# Patient Record
Sex: Male | Born: 1995 | Race: White | Hispanic: No | Marital: Single | Smoking: Never smoker
Health system: Southern US, Community
[De-identification: ages and names within clinical notes are randomized; demographics above are authoritative.]

## PROBLEM LIST (undated history)

## (undated) DIAGNOSIS — M869 Osteomyelitis, unspecified: Secondary | ICD-10-CM

## (undated) HISTORY — PX: SKIN BIOPSY: SHX1

---
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
## 2014-06-13 ENCOUNTER — Emergency Department (HOSPITAL_COMMUNITY)
Admission: EM | Admit: 2014-06-13 | Discharge: 2014-06-13 | Disposition: A | Discharge status: Home / Self Care | Payer: Medicaid Other | Attending: Emergency Medicine | Admitting: Emergency Medicine

## 2014-06-13 ENCOUNTER — Encounter (HOSPITAL_COMMUNITY): Payer: Self-pay | Admitting: Emergency Medicine

## 2014-06-13 DIAGNOSIS — R110 Nausea: Secondary | ICD-10-CM

## 2014-06-13 DIAGNOSIS — Z79899 Other long term (current) drug therapy: Secondary | ICD-10-CM | POA: Insufficient documentation

## 2014-06-13 DIAGNOSIS — K529 Noninfective gastroenteritis and colitis, unspecified: Secondary | ICD-10-CM

## 2014-06-13 DIAGNOSIS — M549 Dorsalgia, unspecified: Secondary | ICD-10-CM | POA: Insufficient documentation

## 2014-06-13 DIAGNOSIS — Z880 Allergy status to penicillin: Secondary | ICD-10-CM | POA: Insufficient documentation

## 2014-06-13 DIAGNOSIS — R1033 Periumbilical pain: Secondary | ICD-10-CM | POA: Insufficient documentation

## 2014-06-13 DIAGNOSIS — R1030 Lower abdominal pain, unspecified: Secondary | ICD-10-CM

## 2014-06-13 LAB — COMPREHENSIVE METABOLIC PANEL
ALBUMIN: 5 g/dL (ref 3.5–5.2)
ALK PHOS: 60 U/L (ref 39–117)
ALT: 22 U/L (ref 0–53)
ANION GAP: 6 (ref 5–15)
AST: 23 U/L (ref 0–37)
BUN: 9 mg/dL (ref 6–23)
CO2: 28 mmol/L (ref 19–32)
CREATININE: 0.82 mg/dL (ref 0.50–1.35)
Calcium: 8.9 mg/dL (ref 8.4–10.5)
Chloride: 106 mmol/L (ref 96–112)
GFR calc non Af Amer: >90 mL/min (ref >90)
GLUCOSE: 107 mg/dL — AB (ref 70–99)
Potassium: 3.8 mmol/L (ref 3.5–5.1)
Sodium: 140 mmol/L (ref 135–145)
TOTAL PROTEIN: 7.7 g/dL (ref 6.0–8.3)
Total Bilirubin: 1.2 mg/dL (ref 0.3–1.2)

## 2014-06-13 LAB — CBC WITH DIFFERENTIAL/PLATELET
BASOS ABS: 0 10*3/uL (ref 0.0–0.1)
Basophils Relative: 0 % (ref 0–1)
EOS PCT: 3 % (ref 0–5)
Eosinophils Absolute: 0.2 10*3/uL (ref 0.0–0.7)
HEMATOCRIT: 44.7 % (ref 39.0–52.0)
Hemoglobin: 15.6 g/dL (ref 13.0–17.0)
LYMPHS ABS: 1.6 10*3/uL (ref 0.7–4.0)
LYMPHS PCT: 20 % (ref 12–46)
MCH: 29.3 pg (ref 26.0–34.0)
MCHC: 34.9 g/dL (ref 30.0–36.0)
MCV: 83.9 fL (ref 78.0–100.0)
MONO ABS: 0.4 10*3/uL (ref 0.1–1.0)
Monocytes Relative: 5 % (ref 3–12)
Neutro Abs: 5.9 10*3/uL (ref 1.7–7.7)
Neutrophils Relative %: 72 % (ref 43–77)
Platelets: 223 10*3/uL (ref 150–400)
RBC: 5.33 MIL/uL (ref 4.22–5.81)
RDW: 12.8 % (ref 11.5–15.5)
WBC: 8.1 10*3/uL (ref 4.0–10.5)

## 2014-06-13 LAB — LIPASE, BLOOD: Lipase: 27 U/L (ref 11–59)

## 2014-06-13 MED ORDER — ONDANSETRON 8 MG PO TBDP
8.0000 mg | ORAL_TABLET | Freq: Once | ORAL | Status: AC
  Administered 2014-06-13: 8 mg via ORAL
  Filled 2014-06-13: qty 1

## 2014-06-13 MED ORDER — HYDROCODONE-ACETAMINOPHEN 5-325 MG PO TABS: 1.0000 | ORAL_TABLET | Freq: Four times a day (QID) | ORAL | Status: AC | PRN

## 2014-06-13 MED ORDER — NAPROXEN 500 MG PO TABS: 500.0000 mg | ORAL_TABLET | Freq: Two times a day (BID) | ORAL | Status: AC | PRN

## 2014-06-13 MED ORDER — ONDANSETRON 4 MG PO TBDP: 4.0000 mg | ORAL_TABLET | Freq: Three times a day (TID) | ORAL | Status: DC | PRN

## 2014-06-13 MED ORDER — HYDROCODONE-ACETAMINOPHEN 5-325 MG PO TABS
1.0000 | ORAL_TABLET | Freq: Once | ORAL | Status: AC
  Administered 2014-06-13: 1 via ORAL
  Filled 2014-06-13: qty 1

## 2014-06-13 NOTE — Progress Notes (Signed)
EDCM spoke to patient at bedside.  Patient listed as having Medicaid insurance however, patient reports "It expired."  [facility] which had [facility] listed as his pcp.  [address] [place] [address]  Patient reports this ios still his pcp but wants to change it.  [facility] with phone number and address to DSS.  EDCM instructed patient to contact DSS to check status of Medicaid and to change pcp to receive new Medicaid card.  [facility] with list of [place].  Patient thankful for resources.  No further EDCM needs at this time.

## 2014-06-13 NOTE — ED Provider Notes (Signed)
CSN: [id number]     Arrival date & time 06/13/14  1540 History   First MD Initiated Contact with Patient 06/13/14 1630     Chief Complaint  Patient presents with  . Abdominal Pain     (Consider location/radiation/quality/duration/timing/severity/associated sxs/prior Treatment) HPI Comments: Christopher Horton is a 19 y.o. male with a PMHx of remote osteomyelitis but otherwise healthy, who presents to the ED with complaints of lower abdominal/periumbilical pain that began gradually last night after he ate a lobster-claw at his girlfriend's house, and has gradually worsened since then. He states the pain is 6/10 tightness dull burning, radiating to his back, constant, waxing and waning, with no known aggravating factors, and unrelieved Pepto-Bismol. He states he ate the lobster-claw which she has eaten in the past, but since the pain started after eating this he is unsure if this was the source of the problem, but that no other family members have become ill. He denies any fevers, chills, chest pain, shortness of breath, vomiting, diarrhea, constipation, melena, hematochezia, obstipation, rectal pain, flank pain, dysuria, hematuria, testicular pain or swelling, penile discharge, numbness, tingling, weakness, sick contacts, alcohol use, NSAID use, recent travel, or antibiotic use. His last bowel movement was yesterday around midday, and he reports that it was "normal".  Patient is a 19 y.o. male presenting with abdominal pain. The history is provided by the patient. No language interpreter was used.  Abdominal Pain Pain location:  Periumbilical Pain quality: burning and dull   Pain quality comment:  Tightness/dull/burning Pain radiates to:  Back Pain severity:  Moderate Onset quality:  Gradual Duration:  1 day Timing:  Constant Progression:  Worsening Chronicity:  New Context: suspicious food intake   Context: not alcohol use, not recent illness, not recent travel and not sick contacts     Relieved by:  Nothing Worsened by:  Nothing tried Ineffective treatments:  Antacids (pepto bismol) Associated symptoms: nausea   Associated symptoms: no chest pain, no chills, no constipation, no diarrhea, no dysuria, no fever, no flatus, no hematemesis, no hematochezia, no hematuria, no melena, no shortness of breath and no vomiting   Risk factors: no alcohol abuse, has not had multiple surgeries, no NSAID use and not obese     Past Medical History  Diagnosis Date  . Osteomyelitis    Past Surgical History  Procedure Laterality Date  . Skin biopsy     No family history on file. History  Substance Use Topics  . Smoking status: Never Smoker   . Smokeless tobacco: Not on file  . Alcohol Use: No    Review of Systems  Constitutional: Negative for fever and chills.  Respiratory: Negative for shortness of breath.   Cardiovascular: Negative for chest pain.  Gastrointestinal: Positive for nausea and abdominal pain. Negative for vomiting, diarrhea, constipation, blood in stool, melena, hematochezia, rectal pain, flatus and hematemesis.  Genitourinary: Negative for dysuria, hematuria, flank pain, discharge, scrotal swelling and testicular pain.  Musculoskeletal: Positive for back pain (radiating from abdomen). Negative for myalgias and arthralgias.  Skin: Negative for rash.  Allergic/Immunologic: Negative for immunocompromised state.  Neurological: Negative for weakness and numbness.  Psychiatric/Behavioral: Negative for confusion.   10 Systems reviewed and are negative for acute change except as noted in the HPI.    Allergies  Peanut-containing drug products; Dust mite extract; and Amoxicillin  Home Medications   Prior to Admission medications   Medication Sig Start Date End Date Taking? Authorizing Provider  methocarbamol (ROBAXIN) 500 MG tablet Take 1  tablet (500 mg total) by mouth 2 (two) times daily as needed for muscle spasms. Patient not taking: Reported on 06/13/2014  02/09/13   Emilia Beck, PA-C  naproxen (NAPROSYN) 500 MG tablet Take 1 tablet (500 mg total) by mouth 2 (two) times daily with a meal. Patient not taking: Reported on 06/13/2014 02/09/13   Kaitlyn Szekalski, PA-C   BP 135/66 mmHg  Pulse 66  Temp(Src) 98.2 F (36.8 C) (Oral)  Resp 20  SpO2 100% Physical Exam  Constitutional: He is oriented to person, place, and time. Vital signs are normal. He appears well-developed and well-nourished.  Non-toxic appearance. No distress.  Afebrile, nontoxic, NAD  HENT:  Head: Normocephalic and atraumatic.  Mouth/Throat: Oropharynx is clear and moist and mucous membranes are normal.  Eyes: Conjunctivae and EOM are normal. Right eye exhibits no discharge. Left eye exhibits no discharge.  Neck: Normal range of motion. Neck supple.  Cardiovascular: Normal rate, regular rhythm, normal heart sounds and intact distal pulses.  Exam reveals no gallop and no friction rub.   No murmur heard. Pulmonary/Chest: Effort normal and breath sounds normal. No respiratory distress. He has no decreased breath sounds. He has no wheezes. He has no rhonchi. He has no rales.  Abdominal: Soft. Normal appearance and bowel sounds are normal. He exhibits no distension. There is tenderness in the suprapubic area. There is no rigidity, no rebound, no guarding, no CVA tenderness, no tenderness at McBurney's point and negative Murphy's sign.    Soft, nondistended, +BS throughout, with very mild suprapubic and R lateral abd TTP, no r/g/r, neg murphy's, neg mcburney's, no CVA TTP   Musculoskeletal: Normal range of motion.  Neurological: He is alert and oriented to person, place, and time. He has normal strength. No sensory deficit.  Skin: Skin is warm, dry and intact. No rash noted.  Psychiatric: He has a normal mood and affect.  Nursing note and vitals reviewed.   ED Course  Procedures (including critical care time) Labs Review Labs Reviewed  COMPREHENSIVE METABOLIC PANEL -  Abnormal; Notable for the following:    Glucose, Bld 107 (*)    All other components within normal limits  LIPASE, BLOOD  CBC WITH DIFFERENTIAL/PLATELET  URINALYSIS, ROUTINE W REFLEX MICROSCOPIC    Imaging Review No results found.   EKG Interpretation None      MDM   Final diagnoses:  Lower abdominal pain  Nausea  Gastroenteritis    19 y.o. male here with lower abd pain after eating lobster claw last night, associated with nausea. On exam, very slightly suprapubic tenderness and R lateral abd tenderness, non-peritoneal exam. VSS, overall well appearing. No rectal pain or urinary complaints. Will get basic labs but doubt need for imaging at this time unless labs show any acute abnormalities. Likely just mild gastroenteritis or colitis from foodbourne source, although no diarrhea today. Could be viral illness. Will await labs. Will give pain and nausea meds PO now since pt states his nausea has improved. Doubt need for IV at this time. Will recheck after labs.   6:46 PM CMP unremarkable. Lipase WNL. CBC and U/A WNL. Likely viral gastroenteritis. Tolerating PO well here, and feels improved with both nausea and pain. Will send home with naprosyn/norco/zofran. Will have him f/up with PCP in 1wk. I explained the diagnosis and have given explicit precautions to return to the ER including for any other new or worsening symptoms. The patient understands and accepts the medical plan as it's been dictated and I have answered their  questions. Discharge instructions concerning home care and prescriptions have been given. The patient is STABLE and is discharged to home in good condition.  BP 135/66 mmHg  Pulse 66  Temp(Src) 98.2 F (36.8 C) (Oral)  Resp 20  SpO2 100%  Meds ordered this encounter  Medications  . HYDROcodone-acetaminophen (NORCO/VICODIN) 5-325 MG per tablet 1 tablet    Sig:   . ondansetron (ZOFRAN-ODT) disintegrating tablet 8 mg    Sig:   . HYDROcodone-acetaminophen (NORCO)  5-325 MG per tablet    Sig: Take 1 tablet by mouth every 6 (six) hours as needed for severe pain.    Dispense:  6 tablet    Refill:  0    Order Specific Question:  Supervising Provider    Answer:  MILLER, BRIAN [[id number]]  . ondansetron (ZOFRAN ODT) 4 MG disintegrating tablet    Sig: Take 1 tablet (4 mg total) by mouth every 8 (eight) hours as needed for nausea or vomiting.    Dispense:  15 tablet    Refill:  0    Order Specific Question:  Supervising Provider    Answer:  MILLER, BRIAN [[id number]]  . naproxen (NAPROSYN) 500 MG tablet    Sig: Take 1 tablet (500 mg total) by mouth 2 (two) times daily as needed for mild pain, moderate pain or headache (TAKE WITH MEALS.).    Dispense:  20 tablet    Refill:  0    Order Specific Question:  Supervising Provider    Answer:  Eber Hong [[id number]]     Rasa Degrazia Camprubi-Soms, PA-C 06/13/14 1846  Lorre Nick, MD 06/14/14 1527

## 2014-06-13 NOTE — ED Notes (Signed)
Pt reports lower abdominal pain starting last night accompanied by nausea; denies vomiting or diarrhea. Pt reports last BM yesterday.

## 2014-06-13 NOTE — Discharge Instructions (Signed)
Your abdominal pain could be from the food you ate, or from a virus. Use zofran as needed for nausea, and naprosyn and norco as directed as needed for pain but don't drive while taking norco. Stay well hydrated with small sips of fluids throughout the day. Follow up with your regular doctor in 1 week for recheck. Return to the ER for changes or worsening symptoms.  Abdominal (belly) pain can be caused by many things. Your caregiver performed an examination and possibly ordered blood/urine tests and imaging (CT scan, x-rays, ultrasound). Many cases can be observed and treated at home after initial evaluation in the emergency department. Even though you are being discharged home, abdominal pain can be unpredictable. Therefore, you need a repeated exam if your pain does not resolve, returns, or worsens. Most patients with abdominal pain don't have to be admitted to the hospital or have surgery, but serious problems like appendicitis and gallbladder attacks can start out as nonspecific pain. Many abdominal conditions cannot be diagnosed in one visit, so follow-up evaluations are very important. SEEK IMMEDIATE MEDICAL ATTENTION IF YOU DEVELOP ANY OF THE FOLLOWING SYMPTOMS:  The pain does not go away or becomes severe.   A temperature above 101 develops.   Repeated vomiting occurs (multiple episodes).   The pain becomes localized to portions of the abdomen. The right side could possibly be appendicitis. In an adult, the left lower portion of the abdomen could be colitis or diverticulitis.   Blood is being passed in stools or vomit (bright red or black tarry stools).   Return also if you develop chest pain, difficulty breathing, dizziness or fainting, or become confused, poorly responsive, or inconsolable (young children).  The constipation stays for more than 4 days.   There is belly (abdominal) or rectal pain.   You do not seem to be getting better.     Abdominal Pain Many things can cause belly  (abdominal) pain. Most times, the belly pain is not dangerous. Many cases of belly pain can be watched and treated at home. HOME CARE   Do not take medicines that help you go poop (laxatives) unless told to by your doctor.  Only take medicine as told by your doctor.  Eat or drink as told by your doctor. Your doctor will tell you if you should be on a special diet. GET HELP IF:  You do not know what is causing your belly pain.  You have belly pain while you are sick to your stomach (nauseous) or have runny poop (diarrhea).  You have pain while you pee or poop.  Your belly pain wakes you up at night.  You have belly pain that gets worse or better when you eat.  You have belly pain that gets worse when you eat fatty foods.  You have a fever. GET HELP RIGHT AWAY IF:   The pain does not go away within 2 hours.  You keep throwing up (vomiting).  The pain changes and is only in the right or left part of the belly.  You have bloody or tarry looking poop. MAKE SURE YOU:   Understand these instructions.  Will watch your condition.  Will get help right away if you are not doing well or get worse. Document Released: 07/27/2007 Document Revised: 02/12/2013 Document Reviewed: 10/17/2012 [facility] Patient Information 2015 [place], [place]. This information is not intended to replace advice given to you by your health care provider. Make sure you discuss any questions you have with your health care  provider.   Nausea, Adult Nausea means you feel sick to your stomach or need to throw up (vomit). It may be a sign of a more serious problem. If nausea gets worse, you may throw up. If you throw up a lot, you may lose too much body fluid (dehydration). HOME CARE   Get plenty of rest.  Ask your doctor how to replace body fluid losses (rehydrate).  Eat small amounts of food. Sip liquids more often.  Take all medicines as told by your doctor. GET HELP RIGHT AWAY IF:  You have a  fever.  You pass out (faint).  You keep throwing up or have blood in your throw up.  You are very weak, have dry lips or a dry mouth, or you are very thirsty (dehydrated).  You have dark or bloody poop (stool).  You have very bad chest or belly (abdominal) pain.  You do not get better after 2 days, or you get worse.  You have a headache. MAKE SURE YOU:  Understand these instructions.  Will watch your condition.  Will get help right away if you are not doing well or get worse. Document Released: 01/27/2011 Document Revised: 05/02/2011 Document Reviewed: 01/27/2011 [facility] Patient Information 2015 [place], [place]. This information is not intended to replace advice given to you by your health care provider. Make sure you discuss any questions you have with your health care provider.  Viral Gastroenteritis Viral gastroenteritis is also known as stomach flu. This condition affects the stomach and intestinal tract. It can cause sudden diarrhea and vomiting. The illness typically lasts 3 to 8 days. Most people develop an immune response that eventually gets rid of the virus. While this natural response develops, the virus can make you quite ill. CAUSES  Many different viruses can cause gastroenteritis, such as rotavirus or noroviruses. You can catch one of these viruses by consuming contaminated food or water. You may also catch a virus by sharing utensils or other personal items with an infected person or by touching a contaminated surface. SYMPTOMS  The most common symptoms are diarrhea and vomiting. These problems can cause a severe loss of body fluids (dehydration) and a body salt (electrolyte) imbalance. Other symptoms may include:  Fever.  Headache.  Fatigue.  Abdominal pain. DIAGNOSIS  Your caregiver can usually diagnose viral gastroenteritis based on your symptoms and a physical exam. A stool sample may also be taken to test for the presence of viruses or other  infections. TREATMENT  This illness typically goes away on its own. Treatments are aimed at rehydration. The most serious cases of viral gastroenteritis involve vomiting so severely that you are not able to keep fluids down. In these cases, fluids must be given through an intravenous line (IV). HOME CARE INSTRUCTIONS   Drink enough fluids to keep your urine clear or pale yellow. Drink small amounts of fluids frequently and increase the amounts as tolerated.  Ask your caregiver for specific rehydration instructions.  Avoid:  Foods high in sugar.  Alcohol.  Carbonated drinks.  Tobacco.  Juice.  Caffeine drinks.  Extremely hot or cold fluids.  Fatty, greasy foods.  Too much intake of anything at one time.  Dairy products until 24 to 48 hours after diarrhea stops.  You may consume probiotics. Probiotics are active cultures of beneficial bacteria. They may lessen the amount and number of diarrheal stools in adults. Probiotics can be found in yogurt with active cultures and in supplements.  Wash your hands well to avoid  spreading the virus.  Only take over-the-counter or prescription medicines for pain, discomfort, or fever as directed by your caregiver. Do not give aspirin to children. Antidiarrheal medicines are not recommended.  Ask your caregiver if you should continue to take your regular prescribed and over-the-counter medicines.  Keep all follow-up appointments as directed by your caregiver. SEEK IMMEDIATE MEDICAL CARE IF:   You are unable to keep fluids down.  You do not urinate at least once every 6 to 8 hours.  You develop shortness of breath.  You notice blood in your stool or vomit. This may look like coffee grounds.  You have abdominal pain that increases or is concentrated in one small area (localized).  You have persistent vomiting or diarrhea.  You have a fever.  The patient is a child younger than 3 months, and he or she has a fever.  The patient  is a child older than 3 months, and he or she has a fever and persistent symptoms.  The patient is a child older than 3 months, and he or she has a fever and symptoms suddenly get worse.  The patient is a baby, and he or she has no tears when crying. MAKE SURE YOU:   Understand these instructions.  Will watch your condition.  Will get help right away if you are not doing well or get worse. Document Released: 02/07/2005 Document Revised: 05/02/2011 Document Reviewed: 11/24/2010 [facility] Patient Information 2015 [place], [place]. This information is not intended to replace advice given to you by your health care provider. Make sure you discuss any questions you have with your health care provider.

## 2014-06-18 LAB — URINALYSIS, ROUTINE W REFLEX MICROSCOPIC
Bilirubin Urine: NEGATIVE
Glucose, UA: NEGATIVE mg/dL
Hgb urine dipstick: NEGATIVE
KETONES UR: NEGATIVE mg/dL
LEUKOCYTES UA: NEGATIVE
NITRITE: NEGATIVE
PROTEIN: NEGATIVE mg/dL
Specific Gravity, Urine: 1.02 (ref 1.005–1.030)
UROBILINOGEN UA: 0.2 mg/dL (ref 0.0–1.0)
pH: 6.5 (ref 5.0–8.0)

## 2014-06-23 ENCOUNTER — Emergency Department (HOSPITAL_COMMUNITY)
Admission: EM | Admit: 2014-06-23 | Discharge: 2014-06-23 | Disposition: A | Discharge status: Home / Self Care | Payer: Self-pay | Attending: Emergency Medicine | Admitting: Emergency Medicine

## 2014-06-23 ENCOUNTER — Encounter (HOSPITAL_COMMUNITY): Payer: Self-pay

## 2014-06-23 ENCOUNTER — Ambulatory Visit (HOSPITAL_COMMUNITY): Payer: Self-pay

## 2014-06-23 DIAGNOSIS — Z880 Allergy status to penicillin: Secondary | ICD-10-CM | POA: Insufficient documentation

## 2014-06-23 DIAGNOSIS — Z791 Long term (current) use of non-steroidal anti-inflammatories (NSAID): Secondary | ICD-10-CM | POA: Insufficient documentation

## 2014-06-23 DIAGNOSIS — R1031 Right lower quadrant pain: Secondary | ICD-10-CM | POA: Insufficient documentation

## 2014-06-23 DIAGNOSIS — R109 Unspecified abdominal pain: Secondary | ICD-10-CM

## 2014-06-23 DIAGNOSIS — Z8739 Personal history of other diseases of the musculoskeletal system and connective tissue: Secondary | ICD-10-CM | POA: Insufficient documentation

## 2014-06-23 LAB — CBC WITH DIFFERENTIAL/PLATELET
Basophils Absolute: 0 10*3/uL (ref 0.0–0.1)
Basophils Relative: 0 % (ref 0–1)
Eosinophils Absolute: 1.1 10*3/uL — ABNORMAL HIGH (ref 0.0–0.7)
Eosinophils Relative: 11 % — ABNORMAL HIGH (ref 0–5)
HCT: 45.4 % (ref 39.0–52.0)
HEMOGLOBIN: 15.5 g/dL (ref 13.0–17.0)
LYMPHS PCT: 16 % (ref 12–46)
Lymphs Abs: 1.5 10*3/uL (ref 0.7–4.0)
MCH: 28.8 pg (ref 26.0–34.0)
MCHC: 34.1 g/dL (ref 30.0–36.0)
MCV: 84.2 fL (ref 78.0–100.0)
MONO ABS: 0.6 10*3/uL (ref 0.1–1.0)
MONOS PCT: 6 % (ref 3–12)
NEUTROS PCT: 67 % (ref 43–77)
Neutro Abs: 6.2 10*3/uL (ref 1.7–7.7)
Platelets: 227 10*3/uL (ref 150–400)
RBC: 5.39 MIL/uL (ref 4.22–5.81)
RDW: 12.8 % (ref 11.5–15.5)
WBC: 9.4 10*3/uL (ref 4.0–10.5)

## 2014-06-23 LAB — COMPREHENSIVE METABOLIC PANEL
ALBUMIN: 4.7 g/dL (ref 3.5–5.0)
ALK PHOS: 63 U/L (ref 38–126)
ALT: 21 U/L (ref 17–63)
AST: 23 U/L (ref 15–41)
Anion gap: 9 (ref 5–15)
BUN: 12 mg/dL (ref 6–20)
CO2: 28 mmol/L (ref 22–32)
Calcium: 9.6 mg/dL (ref 8.9–10.3)
Chloride: 106 mmol/L (ref 101–111)
Creatinine, Ser: 0.9 mg/dL (ref 0.61–1.24)
GFR calc Af Amer: >60 mL/min (ref >60)
GFR calc non Af Amer: >60 mL/min (ref >60)
Glucose, Bld: 78 mg/dL (ref 70–99)
POTASSIUM: 4.2 mmol/L (ref 3.5–5.1)
Sodium: 143 mmol/L (ref 135–145)
TOTAL PROTEIN: 7.5 g/dL (ref 6.5–8.1)
Total Bilirubin: 1.1 mg/dL (ref 0.3–1.2)

## 2014-06-23 LAB — URINALYSIS, ROUTINE W REFLEX MICROSCOPIC
BILIRUBIN URINE: NEGATIVE
Glucose, UA: NEGATIVE mg/dL
HGB URINE DIPSTICK: NEGATIVE
KETONES UR: NEGATIVE mg/dL
Leukocytes, UA: NEGATIVE
NITRITE: NEGATIVE
Protein, ur: NEGATIVE mg/dL
Specific Gravity, Urine: 1.006 (ref 1.005–1.030)
Urobilinogen, UA: 0.2 mg/dL (ref 0.0–1.0)
pH: 6.5 (ref 5.0–8.0)

## 2014-06-23 LAB — LIPASE, BLOOD: Lipase: 30 U/L (ref 22–51)

## 2014-06-23 MED ORDER — IOHEXOL 300 MG/ML  SOLN
100.0000 mL | Freq: Once | INTRAMUSCULAR | Status: AC | PRN
  Administered 2014-06-23: 100 mL via INTRAVENOUS

## 2014-06-23 MED ORDER — GI COCKTAIL ~~LOC~~
30.0000 mL | Freq: Once | ORAL | Status: AC
Start: 2014-06-23 — End: 2014-06-23
  Administered 2014-06-23: 30 mL via ORAL
  Filled 2014-06-23: qty 30

## 2014-06-23 MED ORDER — FAMOTIDINE 20 MG PO TABS
20.0000 mg | ORAL_TABLET | Freq: Once | ORAL | Status: AC
  Administered 2014-06-23: 20 mg via ORAL
  Filled 2014-06-23: qty 1

## 2014-06-23 MED ORDER — IOHEXOL 300 MG/ML  SOLN
50.0000 mL | Freq: Once | INTRAMUSCULAR | Status: AC | PRN
  Administered 2014-06-23: 50 mL via ORAL

## 2014-06-23 NOTE — ED Provider Notes (Signed)
CSN: [id number]     Arrival date & time 06/23/14  1159 History   First MD Initiated Contact with Patient 06/23/14 1257     Chief Complaint  Patient presents with  . Abdominal Pain     (Consider location/radiation/quality/duration/timing/severity/associated sxs/prior Treatment) Patient is a 19 y.o. male presenting with abdominal pain. The history is provided by the patient.  Abdominal Pain Associated symptoms: no chest pain, no cough, no dysuria, no fever, no shortness of breath, no sore throat and no vomiting   Patient presents w persistent/worsening mid to lower abdominal pain over the past 2-3 days. Pain constant, dull, moderate. ?decreased appetite. No nv. No diarrhea or constipation. Denies hx similar pain. States was recently seen w same in ED, and told to return if pain worse, and that pain has become worse. No specific or consistent exacerbating or alleviating factors. No dysuria, hematuria or gu c/o. No back or flank pain. No prior abd surgery. No recent abd wall injury or strain. No cough or uri c/o. No cp.      Past Medical History  Diagnosis Date  . Osteomyelitis    Past Surgical History  Procedure Laterality Date  . Skin biopsy     History reviewed. No pertinent family history. History  Substance Use Topics  . Smoking status: Never Smoker   . Smokeless tobacco: Not on file  . Alcohol Use: No    Review of Systems  Constitutional: Negative for fever.  HENT: Negative for sore throat.   Eyes: Negative for redness.  Respiratory: Negative for cough and shortness of breath.   Cardiovascular: Negative for chest pain.  Gastrointestinal: Positive for abdominal pain. Negative for vomiting.  Genitourinary: Negative for dysuria and flank pain.  Musculoskeletal: Negative for back pain and neck pain.  Skin: Negative for rash.  Neurological: Negative for headaches.  Hematological: Does not bruise/bleed easily.  Psychiatric/Behavioral: Negative for confusion.       Allergies  Peanut-containing drug products; Dust mite extract; and Amoxicillin  Home Medications   Prior to Admission medications   Medication Sig Start Date End Date Taking? Authorizing Provider  HYDROcodone-acetaminophen (NORCO) 5-325 MG per tablet Take 1 tablet by mouth every 6 (six) hours as needed for severe pain. 06/13/14  Yes Mercedes Camprubi-Soms, PA-C  naproxen (NAPROSYN) 500 MG tablet Take 1 tablet (500 mg total) by mouth 2 (two) times daily as needed for mild pain, moderate pain or headache (TAKE WITH MEALS.). 06/13/14  Yes Mercedes Camprubi-Soms, PA-C  methocarbamol (ROBAXIN) 500 MG tablet Take 1 tablet (500 mg total) by mouth 2 (two) times daily as needed for muscle spasms. Patient not taking: Reported on 06/13/2014 02/09/13   Emilia Beck, PA-C  naproxen (NAPROSYN) 500 MG tablet Take 1 tablet (500 mg total) by mouth 2 (two) times daily with a meal. Patient not taking: Reported on 06/13/2014 02/09/13   Emilia Beck, PA-C  ondansetron (ZOFRAN ODT) 4 MG disintegrating tablet Take 1 tablet (4 mg total) by mouth every 8 (eight) hours as needed for nausea or vomiting. Patient not taking: Reported on 06/23/2014 06/13/14   Mercedes Camprubi-Soms, PA-C   BP 126/65 mmHg  Pulse 75  Temp(Src) 98.3 F (36.8 C) (Oral)  Resp 18  SpO2 100% Physical Exam  Constitutional: He is oriented to person, place, and time. He appears well-developed and well-nourished. No distress.  HENT:  Head: Atraumatic.  Eyes: Conjunctivae are normal. No scleral icterus.  Neck: Neck supple. No tracheal deviation present.  Cardiovascular: Normal rate, regular rhythm, normal heart sounds  and intact distal pulses.   Pulmonary/Chest: Effort normal and breath sounds normal. No accessory muscle usage. No respiratory distress.  Abdominal: Soft. Bowel sounds are normal. He exhibits no distension and no mass. There is tenderness. There is no rebound and no guarding.  Mid abd to right lower abd tenderness,  no rebound or guarding.   Genitourinary:  No cva tenderness. No scrotal or testicular pain or tenderness.   Musculoskeletal: Normal range of motion. He exhibits no edema or tenderness.  Neurological: He is alert and oriented to person, place, and time.  Skin: Skin is warm and dry. He is not diaphoretic.  Psychiatric: He has a normal mood and affect.  Nursing note and vitals reviewed.   ED Course  Procedures (including critical care time) Labs Review  Results for orders placed or performed during the hospital encounter of 06/23/14  CBC with Differential  Result Value Ref Range   WBC 9.4 4.0 - 10.5 K/uL   RBC 5.39 4.22 - 5.81 MIL/uL   Hemoglobin 15.5 13.0 - 17.0 g/dL   HCT 16.1 09.6 - 04.5 %   MCV 84.2 78.0 - 100.0 fL   MCH 28.8 26.0 - 34.0 pg   MCHC 34.1 30.0 - 36.0 g/dL   RDW 40.9 81.1 - 91.4 %   Platelets 227 150 - 400 K/uL   Neutrophils Relative % 67 43 - 77 %   Neutro Abs 6.2 1.7 - 7.7 K/uL   Lymphocytes Relative 16 12 - 46 %   Lymphs Abs 1.5 0.7 - 4.0 K/uL   Monocytes Relative 6 3 - 12 %   Monocytes Absolute 0.6 0.1 - 1.0 K/uL   Eosinophils Relative 11 (H) 0 - 5 %   Eosinophils Absolute 1.1 (H) 0.0 - 0.7 K/uL   Basophils Relative 0 0 - 1 %   Basophils Absolute 0.0 0.0 - 0.1 K/uL  Comprehensive metabolic panel  Result Value Ref Range   Sodium 143 135 - 145 mmol/L   Potassium 4.2 3.5 - 5.1 mmol/L   Chloride 106 101 - 111 mmol/L   CO2 28 22 - 32 mmol/L   Glucose, Bld 78 70 - 99 mg/dL   BUN 12 6 - 20 mg/dL   Creatinine, Ser 7.82 0.61 - 1.24 mg/dL   Calcium 9.6 8.9 - 95.6 mg/dL   Total Protein 7.5 6.5 - 8.1 g/dL   Albumin 4.7 3.5 - 5.0 g/dL   AST 23 15 - 41 U/L   ALT 21 17 - 63 U/L   Alkaline Phosphatase 63 38 - 126 U/L   Total Bilirubin 1.1 0.3 - 1.2 mg/dL   GFR calc non Af Amer >60 >60 mL/min   GFR calc Af Amer >60 >60 mL/min   Anion gap 9 5 - 15  Lipase, blood  Result Value Ref Range   Lipase 30 22 - 51 U/L  Urinalysis, Routine w reflex microscopic  Result  Value Ref Range   Color, Urine YELLOW YELLOW   APPearance CLEAR CLEAR   Specific Gravity, Urine 1.006 1.005 - 1.030   pH 6.5 5.0 - 8.0   Glucose, UA NEGATIVE NEGATIVE mg/dL   Hgb urine dipstick NEGATIVE NEGATIVE   Bilirubin Urine NEGATIVE NEGATIVE   Ketones, ur NEGATIVE NEGATIVE mg/dL   Protein, ur NEGATIVE NEGATIVE mg/dL   Urobilinogen, UA 0.2 0.0 - 1.0 mg/dL   Nitrite NEGATIVE NEGATIVE   Leukocytes, UA NEGATIVE NEGATIVE   Ct Abdomen Pelvis W Contrast  06/23/2014   CLINICAL DATA:  Gastritis diagnosed 1  week ago. Low abdominal pain with nausea.  EXAM: CT ABDOMEN AND PELVIS WITH CONTRAST  TECHNIQUE: Multidetector CT imaging of the abdomen and pelvis was performed using the standard protocol following bolus administration of intravenous contrast.  CONTRAST:  100mL OMNIPAQUE IOHEXOL 300 MG/ML  SOLN  COMPARISON:  None.  FINDINGS: Lower chest: Clear lung bases. Normal heart size without pericardial or pleural effusion.  Hepatobiliary: Normal liver. Normal gallbladder, without biliary ductal dilatation.  Pancreas: Normal, without mass or ductal dilatation.  Spleen: Normal  Adrenals/Urinary Tract: Normal adrenal glands. Normal kidneys, without hydronephrosis. Normal urinary bladder.  Stomach/Bowel: Normal stomach, without evidence of gastritis. Normal colon and terminal ileum. Normal appendix, including on coronal image 36. Normal small bowel.  Vascular/Lymphatic: Normal caliber of the aorta and branch vessels. No abdominopelvic adenopathy.  Reproductive: Normal prostate.  Other: No significant free fluid.  Musculoskeletal: Disc bulges at L4-5 and L5-S1 are mild.  IMPRESSION: No acute process in the abdomen or pelvis.   Electronically Signed   By: Jeronimo GreavesKyle  Talbot M.D.   On: 06/23/2014 15:29       MDM   Labs. Ct.  Reviewed nursing notes and prior charts for additional history.   pepcid and gi cocktail given for symptom relief.   Ct neg acute.  Pt afeb. No nv.  Ct neg acute.  Pt currently appears  stable for d/c.      Cathren LaineKevin Sherby Moncayo, MD 06/23/14 1540

## 2014-06-23 NOTE — ED Notes (Signed)
Pt here x 1 week ago. Pt dx with abdominal pain/gastritis.  Given scripts for nausea meds, vicodin and naproxen. Filled all but nausea meds d/t finances.  Pt has had nausea with no vomiting.  No change in urination or bowels.  Told to return if pain does not resolve or gets worse.  No fever.

## 2014-06-23 NOTE — Discharge Instructions (Signed)
It was our pleasure to provide your ER care today - we hope that you feel better.  Drink adequate fluids.  You may try pepcid and maalox as need for symptom relief.  Follow up with primary care doctor in coming week for recheck if symptoms fail to improve/resolve.  Return to ER if worse, new symptoms, fevers, persistent vomiting, other concern.     Abdominal Pain Many things can cause abdominal pain. Usually, abdominal pain is not caused by a disease and will improve without treatment. It can often be observed and treated at home. Your health care provider will do a physical exam and possibly order blood tests and X-rays to help determine the seriousness of your pain. However, in many cases, more time must pass before a clear cause of the pain can be found. Before that point, your health care provider may not know if you need more testing or further treatment. HOME CARE INSTRUCTIONS  Monitor your abdominal pain for any changes. The following actions may help to alleviate any discomfort you are experiencing:  Only take over-the-counter or prescription medicines as directed by your health care provider.  Do not take laxatives unless directed to do so by your health care provider.  Try a clear liquid diet (broth, tea, or water) as directed by your health care provider. Slowly move to a bland diet as tolerated. SEEK MEDICAL CARE IF:  You have unexplained abdominal pain.  You have abdominal pain associated with nausea or diarrhea.  You have pain when you urinate or have a bowel movement.  You experience abdominal pain that wakes you in the night.  You have abdominal pain that is worsened or improved by eating food.  You have abdominal pain that is worsened with eating fatty foods.  You have a fever. SEEK IMMEDIATE MEDICAL CARE IF:   Your pain does not go away within 2 hours.  You keep throwing up (vomiting).  Your pain is felt only in portions of the abdomen, such as the right  side or the left lower portion of the abdomen.  You pass bloody or black tarry stools. MAKE SURE YOU:  Understand these instructions.   Will watch your condition.   Will get help right away if you are not doing well or get worse.  Document Released: 11/17/2004 Document Revised: 02/12/2013 Document Reviewed: 10/17/2012 [facility] Patient Information 2015 [place], [place]. This information is not intended to replace advice given to you by your health care provider. Make sure you discuss any questions you have with your health care provider.

## 2015-07-14 ENCOUNTER — Ambulatory Visit (HOSPITAL_COMMUNITY)
Admission: EM | Admit: 2015-07-14 | Discharge: 2015-07-14 | Disposition: A | Discharge status: Home / Self Care | Payer: Medicaid Other | Attending: Emergency Medicine | Admitting: Emergency Medicine

## 2015-07-14 ENCOUNTER — Encounter (HOSPITAL_COMMUNITY): Payer: Self-pay | Admitting: *Deleted

## 2015-07-14 DIAGNOSIS — I889 Nonspecific lymphadenitis, unspecified: Secondary | ICD-10-CM

## 2015-07-14 MED ORDER — CLINDAMYCIN HCL 300 MG PO CAPS: 300.0000 mg | ORAL_CAPSULE | Freq: Three times a day (TID) | ORAL | Status: AC

## 2015-07-14 NOTE — ED Provider Notes (Signed)
CSN: [id number]     Arrival date & time 07/14/15  1710 History   First MD Initiated Contact with Patient 07/14/15 1727     Chief Complaint  Patient presents with  . Lymphadenopathy   (Consider location/radiation/quality/duration/timing/severity/associated sxs/prior Treatment) HPI  He is a 20 year old man here for evaluation of swelling under his left arm. He states for the last week to 10 days he has noticed a lump under his left arm. It has gradually been getting bigger. It is tender. He also reports his arm feels a little numb at times. No fevers, night sweats, or weight loss. No other lumps or bumps elsewhere. He denies any skin infections or recent injuries to his left arm or torso.  Past Medical History  Diagnosis Date  . Osteomyelitis [facility](HCC)    Past Surgical History  Procedure Laterality Date  . Skin biopsy     History reviewed. No pertinent family history. Social History  Substance Use Topics  . Smoking status: Never Smoker   . Smokeless tobacco: None  . Alcohol Use: No    Review of Systems As in history of present illness Allergies  Peanut-containing drug products; Dust mite extract; and Amoxicillin  Home Medications   Prior to Admission medications   Medication Sig Start Date End Date Taking? Authorizing Provider  clindamycin (CLEOCIN) 300 MG capsule Take 1 capsule (300 mg total) by mouth 3 (three) times daily. 07/14/15   Charm RingsErin J Honig, MD  HYDROcodone-acetaminophen (NORCO) 5-325 MG per tablet Take 1 tablet by mouth every 6 (six) hours as needed for severe pain. 06/13/14   Mercedes Camprubi-Soms, PA-C  naproxen (NAPROSYN) 500 MG tablet Take 1 tablet (500 mg total) by mouth 2 (two) times daily as needed for mild pain, moderate pain or headache (TAKE WITH MEALS.). 06/13/14   Mercedes Camprubi-Soms, PA-C   Meds Ordered and Administered this Visit  Medications - No data to display  BP 127/69 mmHg  Pulse 73  Temp(Src) 98.6 F (37 C) (Oral)  Resp 16  SpO2 100% No data  found.   Physical Exam  Constitutional: He is oriented to person, place, and time. He appears well-developed and well-nourished. No distress.  Neck: Neck supple.  Cardiovascular: Normal rate.   Pulmonary/Chest: Effort normal and breath sounds normal. No respiratory distress. He has no wheezes. He has no rales.  Lymphadenopathy:    He has no cervical adenopathy.    He has axillary adenopathy.       Right axillary: No pectoral and no lateral adenopathy present.       Left axillary: Lateral (1.5 cm enlarged lymph node as well as a 1 cm lymph node.) adenopathy present.       Right: No supraclavicular adenopathy present.       Left: No supraclavicular adenopathy present.  Neurological: He is alert and oriented to person, place, and time.  Skin: No rash noted.  No erythema to suggest cellulitis.    ED Course  Procedures (including critical care time)  Labs Review Labs Reviewed - No data to display  Imaging Review No results found.   MDM   1. Lymphadenitis    We'll treat with clindamycin. Return precautions reviewed.    Charm RingsErin J Honig, MD 07/14/15 [phone]

## 2015-07-14 NOTE — Discharge Instructions (Signed)
You have an enlarged lymph node. This is likely due to infection. Take clindamycin 3 times a day for 10 days. It may take several weeks for the lymph node to go all the way back to normal. If you develop fevers, night sweats, weight loss, vomiting, or other symptoms, please go to the emergency room.

## 2015-07-14 NOTE — ED Notes (Signed)
Pt   Reports       Swollen  Tender  Area under  l  Armpit         With  Symptoms  X  10  Days     pt  Reports    The  Area   Is  Getting  Larger

## 2015-12-19 ENCOUNTER — Ambulatory Visit (HOSPITAL_COMMUNITY)
Admission: RE | Admit: 2015-12-19 | Discharge: 2015-12-19 | Disposition: A | Discharge status: Home / Self Care | Payer: Self-pay | Attending: Psychiatry | Admitting: Psychiatry

## 2015-12-19 DIAGNOSIS — F419 Anxiety disorder, unspecified: Secondary | ICD-10-CM | POA: Insufficient documentation

## 2015-12-19 DIAGNOSIS — Z79899 Other long term (current) drug therapy: Secondary | ICD-10-CM | POA: Insufficient documentation

## 2015-12-19 DIAGNOSIS — F329 Major depressive disorder, single episode, unspecified: Secondary | ICD-10-CM | POA: Insufficient documentation

## 2015-12-19 DIAGNOSIS — R45851 Suicidal ideations: Secondary | ICD-10-CM | POA: Insufficient documentation

## 2015-12-19 NOTE — H&P (Signed)
Behavioral Health Medical Screening Exam  Christopher Rankinaron Christopher Horton is a 20 y.o. male who [facility]. His girlfriend was present during the exam per patient's permission. Reports that his [facility] due to his level of anxiety today. Reports that he became anxious at work today and had to leave, is in jeopardy of losing job. Reports that he sometimes becomes very anxious and withdrawn, which has resulted in him losing/quitting a number of jobs. Reports that he has "just the typical symptoms of depression." Reports that he is currently taking Lexapro and Rexulti, which are [facility]. [facility] last week, which is when he started Rexulti. Reports that he has taken prozac in the past. Next appointment [facility] is January 23, 2016. Reports that he has taken ativan and xanax in the past and that they were very helpful. Denies suicidal thoughts at this time. Reports that he has never really thought about suicide, but sometimes feels that he would be "better off dead." Denies homicidal ideations. Denies self harming behaviors. Denies audiovisual hallucinations. Denies alcohol and drug use.  Total Time spent with patient: 20 minutes  Psychiatric Specialty Exam: Physical Exam  Constitutional: He is oriented to person, place, and time. He appears well-developed and well-nourished. No distress.  HENT:  Head: Normocephalic and atraumatic.  Right Ear: External ear normal.  Left Ear: External ear normal.  Eyes: Conjunctivae are normal. Right eye exhibits no discharge. Left eye exhibits no discharge. No scleral icterus.  Cardiovascular: Normal rate, regular rhythm and normal heart sounds.   Respiratory: Effort normal and breath sounds normal.  Musculoskeletal: Normal range of motion.  Neurological: He is alert and oriented to person, place, and time.  Skin: Skin is warm and dry. He is not diaphoretic.  Psychiatric: His speech is normal and  behavior is normal. Judgment and thought content normal. His mood appears anxious. His affect is blunt. His affect is not angry, not labile and not inappropriate. Cognition and memory are normal. He exhibits a depressed mood.    Review of Systems  Psychiatric/Behavioral: Positive for depression and suicidal ideas. Negative for hallucinations, memory loss and substance abuse. The patient is nervous/anxious. The patient does not have insomnia.     There were no vitals taken for this visit.There is no height or weight on file to calculate BMI.  General Appearance: Casual and Well Groomed  Eye Contact:  Good  Speech:  Clear and Coherent and Normal Rate  Volume:  Normal  Mood:  Anxious and Depressed  Affect:  Blunt and Depressed  Thought Process:  Coherent and Goal Directed  Orientation:  Full (Time, Place, and Person)  Thought Content:  Logical  Suicidal Thoughts:  No "I have sometimes felt like I would be better off dead".   Homicidal Thoughts:  No  Memory:  Immediate;   Good Recent;   Good Remote;   Good  Judgement:  Good  Insight:  Good  Psychomotor Activity:  Normal  Concentration: Concentration: Good and Attention Span: Good  Recall:  Good  Fund of Knowledge:Good  Language: Good  Akathisia:  Negative  Handed:  Right  AIMS (if indicated):     Assets:  Communication Skills Desire for Improvement Housing Intimacy Social Support  Sleep:       Musculoskeletal: Strength & Muscle Tone: within normal limits Gait & Station: normal Patient leans: N/A  Blood pressure 126/70, pulse 83, temperature 98.2 F (36.8 C), temperature source  Oral, resp. rate 18, SpO2 100 %.  Recommendations:  Based on my evaluation the patient does not appear to have an emergency medical condition. Requests outpatient resources. Contracts for safety. TTS provided information on outpatient resources and suicide prevention.   Jackelyn PolingJason A Shataria Crist, NP 12/19/2015, 8:48 PM

## 2015-12-19 NOTE — BH Assessment (Signed)
Per Nira ConnJason Berry, FNP pt is recommended to be d/c to his current provider and given OPT resources to assist with feelings of depression and anxiety. Assessor provided the pt with multiple resources to utilize in the event he begins to feel depressed and experiences suicidal ideations. Suicide prevention information was also given to the pt.   Princess BruinsAquicha Duff, MSW, Theresia MajorsLCSWA

## 2015-12-19 NOTE — BH Assessment (Signed)
Tele Assessment Note   Christopher Horton is an 20 y.o. male. presenting [facility] with symptoms of depression and anxiety. Patient reports a long history of depression and anxiety, starting in 6th or 7th grade after numerous medical hospitalizations. Most recently patient reports high anxiety at work to the point he quite several jobs. He felt he had to leave work early today due to anxiety and was told he had until Tuesday to figure something out or he would lose his current job. Patient reports feeling like he is letting his father and girlfriend down. [facility]. Patient is currently taking Lexapro and Rexulti [facility], Tyler AasMichelle Butler.   States he feels a little less tearfulness but still cries daily.  Next appt is December 2nd, with psychiatrist and a new therapist.  Patient describes feels of extreme dread at some times, decreased appetite, decreased concentration. Almost became tearful during interview. States he cries daily.  Had several losses- his brother died of suicide attempt and was found by the patient and his father. His cousin died recently and his grandmother.  When asked about suicidal thoughts, he admits he sees suicide as an option at times but denies ever developing a plan or having intent. Patient admits to ideation early today but denies now and has no plan. States he would be too scared to do anything.   Patient describes alcohol dependence on paternal side and mental health issues on mother side.   Discussed patient's options. Extender will need to see the patient [facility]ender arrives.    Diagnosis: Major Depressive Disorder; GAD  Past Medical History:  Past Medical History:  Diagnosis Date  . Osteomyelitis [facility](HCC)     Past Surgical History:  Procedure Laterality Date  . SKIN BIOPSY      Family History: No family history on file.  Social History:  reports that he has never smoked. He does not  have any smokeless tobacco history on file. He reports that he does not drink alcohol or use drugs.  Additional Social History:  Alcohol / Drug Use Pain Medications: see MAR Prescriptions: see MAR Over the Counter: see MAR History of alcohol / drug use?: No history of alcohol / drug abuse  CIWA:   COWS:    PATIENT STRENGTHS: (choose at least two) Average or above average intelligence Supportive family/friends  Allergies:  Allergies  Allergen Reactions  . Peanut-Containing Drug Products Other (See Comments)    Throat and chest soreness  . Dust Mite Extract Nausea Only    Muscle spasms  . Amoxicillin Palpitations    Pt unsure if this was the antibiotic that caused his symptoms    Home Medications:  (Not in a hospital admission)  OB/GYN Status:  No LMP for male patient.  General Assessment Data [facility] TTS Assessment: In system Is this a Tele or Face-to-Face Assessment?: Face-to-Face Is this an Initial Assessment or a Re-assessment for this encounter?: Initial Assessment Marital status: Single Is patient pregnant?: No Pregnancy Status: No Living Arrangements: Other relatives Can pt return to current living arrangement?: Yes Admission Status: Voluntary Is patient capable of signing voluntary admission?: Yes Referral Source: Self/Family/Friend Insurance type: none  Medical Screening Exam [facility] Walk[facility] ONLY) Medical Exam completed: Yes  Crisis Care Plan Living Arrangements: Other relatives Name of Psychiatrist: Tyler AasMichelle Butler Name of Therapist: n/a  Education Status Is patient currently in school?: Yes Name of school: [employer]  Risk to  self with the past 6 months Suicidal Ideation: Yes-Currently Present Has patient been a risk to self within the past 6 months prior to admission? : Yes Suicidal Intent: No Has patient had any suicidal intent within the past 6 months prior to admission? : No Suicidal Plan?: No Has patient had  any suicidal plan within the past 6 months prior to admission? : No Access to Means: No What has been your use of drugs/alcohol within the last 12 months?: n/a Previous Attempts/Gestures: No Intentional Self Injurious Behavior: None Family Suicide History: Yes (brother) Recent stressful life event(s): Job Loss, Loss (Comment) (risk of job loss, brother died, recently grandma and cousin) Persecutory voices/beliefs?: No Depression: Yes Depression Symptoms: Tearfulness, Isolating, Feeling worthless/self pity, Fatigue Substance abuse history and/or treatment for substance abuse?: No  Risk to Others within the past 6 months Homicidal Ideation: No Does patient have any lifetime risk of violence toward others beyond the six months prior to admission? : No Thoughts of Harm to Others: No Current Homicidal Intent: No Current Homicidal Plan: No Access to Homicidal Means: No History of harm to others?: No Assessment of Violence: None Noted Does patient have access to weapons?: No Criminal Charges Pending?: No Does patient have a court date: No Is patient on probation?: No  Psychosis Hallucinations: None noted Delusions: None noted  Mental Status Report Eye Contact: Fair Motor Activity: Unremarkable Speech: Logical/coherent Level of Consciousness: Alert Mood: Depressed, Anxious, Sad Affect: Appropriate to circumstance Anxiety Level: Severe Thought Processes: Coherent Judgement: Impaired Orientation: Person, Place, Time, Situation  Cognitive Functioning Concentration: Decreased Memory: Recent Intact, Remote Intact IQ: Average Insight: Fair Impulse Control: Good Appetite: Poor Sleep: Decreased  ADLScreening ([facility] Assessment Services) Patient's cognitive ability adequate to safely complete daily activities?: Yes Patient able to express need for assistance with ADLs?: Yes Independently performs ADLs?: Yes (appropriate for developmental age)  Prior Inpatient Therapy Prior  Inpatient Therapy: No  Prior Outpatient Therapy Prior Outpatient Therapy: Yes Prior Therapy Dates: December 2nd Prior Therapy Facilty/Provider(s): [facility] Reason for Treatment: Depression, Anxiety Does patient have an ACCT team?: No Does patient have Intensive In-House Services?  : No Does patient have [facility] services? : Yes Does patient have P4CC services?: No  ADL Screening (condition at time of admission) Patient's cognitive ability adequate to safely complete daily activities?: Yes Patient able to express need for assistance with ADLs?: Yes Independently performs ADLs?: Yes (appropriate for developmental age)                  Additional Information 1:1 In Past 12 Months?: No CIRT Risk: No Elopement Risk: No Does patient have medical clearance?: No     Disposition:  Disposition Initial Assessment Completed for this Encounter: Yes Disposition of Patient: Referred to (To be seen by provider on call. )  Vonzell SchlatterAshley H [facility] 12/19/2015 6:48 PM
